# Patient Record
Sex: Male | Born: 1990 | Race: White | Hispanic: No | Marital: Single
Health system: Southern US, Community
[De-identification: ages and names within clinical notes are randomized; demographics above are authoritative.]

---
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
## 2016-01-11 DIAGNOSIS — F902 Attention-deficit hyperactivity disorder, combined type: Secondary | ICD-10-CM | POA: Diagnosis not present

## 2016-04-11 DIAGNOSIS — F902 Attention-deficit hyperactivity disorder, combined type: Secondary | ICD-10-CM | POA: Diagnosis not present

## 2023-03-19 ENCOUNTER — Telehealth: Payer: Self-pay

## 2023-03-19 NOTE — Telephone Encounter (Signed)
Release emailed to pt.  °
